# Patient Record
Sex: Male | Born: 1945 | Race: White | Hispanic: No | Marital: Married | Smoking: Former smoker
Health system: Southern US, Community
[De-identification: ages and names within clinical notes are randomized; demographics above are authoritative.]

## PROBLEM LIST (undated history)

## (undated) DIAGNOSIS — I10 Essential (primary) hypertension: Secondary | ICD-10-CM

## (undated) DIAGNOSIS — E785 Hyperlipidemia, unspecified: Secondary | ICD-10-CM

## (undated) DIAGNOSIS — N200 Calculus of kidney: Secondary | ICD-10-CM

## (undated) HISTORY — DX: Hyperlipidemia, unspecified: E78.5

## (undated) HISTORY — DX: Essential (primary) hypertension: I10

## (undated) HISTORY — DX: Calculus of kidney: N20.0

---

## 2010-06-25 DIAGNOSIS — R7689 Other specified abnormal immunological findings in serum: Secondary | ICD-10-CM | POA: Insufficient documentation

## 2010-06-25 DIAGNOSIS — R768 Other specified abnormal immunological findings in serum: Secondary | ICD-10-CM | POA: Insufficient documentation

## 2013-04-23 DIAGNOSIS — Z86010 Personal history of colonic polyps: Secondary | ICD-10-CM | POA: Insufficient documentation

## 2013-11-12 ENCOUNTER — Ambulatory Visit: Payer: Self-pay | Admitting: Urology

## 2013-11-25 DIAGNOSIS — N200 Calculus of kidney: Secondary | ICD-10-CM | POA: Insufficient documentation

## 2013-11-28 ENCOUNTER — Ambulatory Visit: Payer: Self-pay | Admitting: Urology

## 2013-11-28 DIAGNOSIS — I10 Essential (primary) hypertension: Secondary | ICD-10-CM

## 2013-11-28 LAB — BASIC METABOLIC PANEL
Anion Gap: 5 — ABNORMAL LOW (ref 7–16)
BUN: 22 mg/dL — ABNORMAL HIGH (ref 7–18)
CALCIUM: 9.2 mg/dL (ref 8.5–10.1)
CO2: 30 mmol/L (ref 21–32)
Chloride: 102 mmol/L (ref 98–107)
Creatinine: 1.02 mg/dL (ref 0.60–1.30)
EGFR (African American): >60
Glucose: 88 mg/dL (ref 65–99)
OSMOLALITY: 277 (ref 275–301)
POTASSIUM: 4.2 mmol/L (ref 3.5–5.1)
Sodium: 137 mmol/L (ref 136–145)

## 2013-12-05 ENCOUNTER — Ambulatory Visit: Payer: Self-pay | Admitting: Urology

## 2015-03-27 IMAGING — CR DG ABDOMEN 1V
1 series · 1 of 1 positions shown · non-contrast
Comparison: None.

CLINICAL DATA: Renal calculus.

EXAM:
ABDOMEN - 1 VIEW

[t abdomen supine]
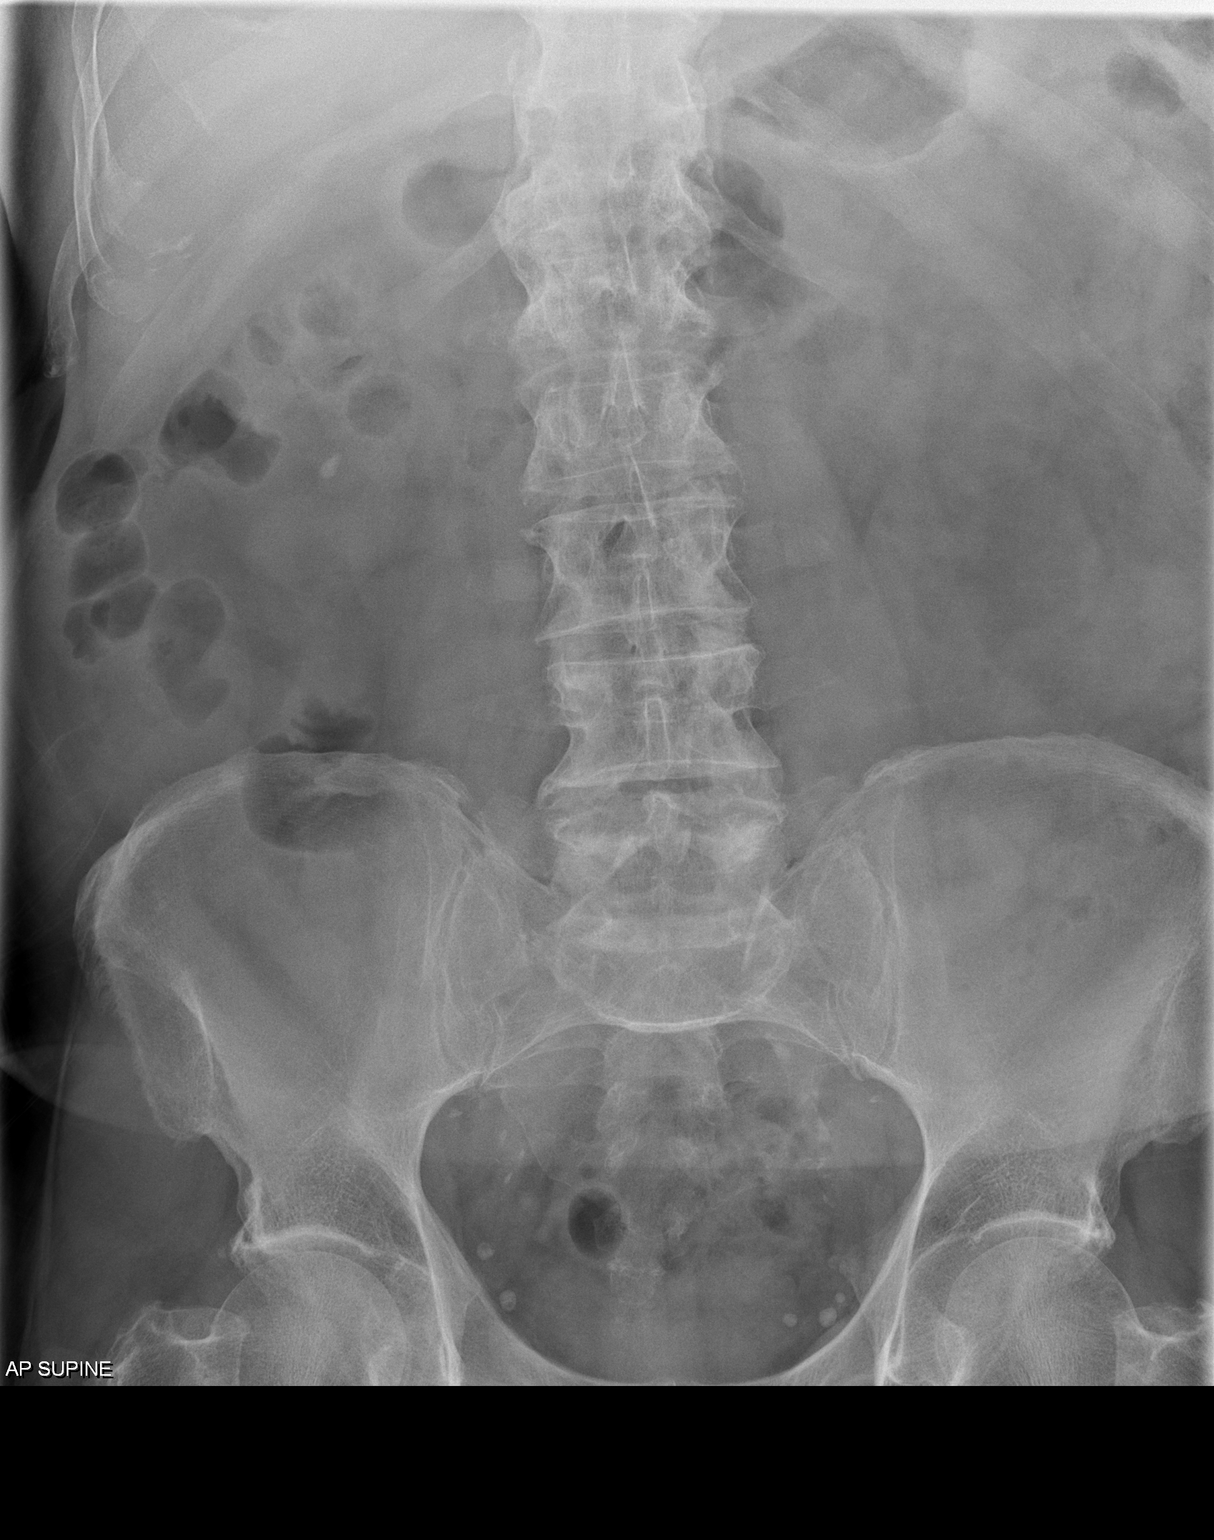

[1 of 1 positions shown; findings below may reference images not displayed]

FINDINGS: Calculus. Approximately 8 mm calculus noted over the right kidney.
Multiple pelvic calcifications are present. These most likely
represent vascular calcifications including phleboliths. Distal
ureteral stone cannot be excluded. Gas pattern is nonspecific.
Degenerative changes lumbar spine and both hips.
IMPRESSION: 8 mm stone right kidney.

## 2018-06-01 ENCOUNTER — Encounter: Payer: Self-pay | Admitting: Urology

## 2018-06-01 ENCOUNTER — Encounter

## 2018-06-01 ENCOUNTER — Ambulatory Visit: Payer: Medicare Other | Admitting: Urology

## 2018-06-01 VITALS — BP 106/71 | HR 62 | Ht 66.0 in | Wt 195.7 lb

## 2018-06-01 DIAGNOSIS — Z8249 Family history of ischemic heart disease and other diseases of the circulatory system: Secondary | ICD-10-CM | POA: Insufficient documentation

## 2018-06-01 DIAGNOSIS — I10 Essential (primary) hypertension: Secondary | ICD-10-CM | POA: Insufficient documentation

## 2018-06-01 DIAGNOSIS — H9190 Unspecified hearing loss, unspecified ear: Secondary | ICD-10-CM | POA: Insufficient documentation

## 2018-06-01 DIAGNOSIS — E669 Obesity, unspecified: Secondary | ICD-10-CM | POA: Insufficient documentation

## 2018-06-01 DIAGNOSIS — N529 Male erectile dysfunction, unspecified: Secondary | ICD-10-CM | POA: Insufficient documentation

## 2018-06-01 DIAGNOSIS — R739 Hyperglycemia, unspecified: Secondary | ICD-10-CM | POA: Insufficient documentation

## 2018-06-01 DIAGNOSIS — E782 Mixed hyperlipidemia: Secondary | ICD-10-CM | POA: Insufficient documentation

## 2018-06-01 DIAGNOSIS — Z87442 Personal history of urinary calculi: Secondary | ICD-10-CM | POA: Diagnosis not present

## 2018-06-01 DIAGNOSIS — K5790 Diverticulosis of intestine, part unspecified, without perforation or abscess without bleeding: Secondary | ICD-10-CM | POA: Insufficient documentation

## 2018-06-01 LAB — MICROSCOPIC EXAMINATION
Epithelial Cells (non renal): NONE SEEN /hpf (ref 0–10)
WBC, UA: NONE SEEN /hpf (ref 0–5)

## 2018-06-01 LAB — URINALYSIS, COMPLETE
Bilirubin, UA: NEGATIVE
GLUCOSE, UA: NEGATIVE
Ketones, UA: NEGATIVE
LEUKOCYTES UA: NEGATIVE
Nitrite, UA: NEGATIVE
PROTEIN UA: NEGATIVE
Specific Gravity, UA: 1.025 (ref 1.005–1.030)
UUROB: 0.2 mg/dL (ref 0.2–1.0)
pH, UA: 6.5 (ref 5.0–7.5)

## 2018-06-01 NOTE — Progress Notes (Signed)
06/01/2018 2:12 PM   Joe Sosa 03/26/1946 [id number]  Referring provider: Garnette CzechKlein, Jonathan E, MD [address] [place], [place] [id number]  Chief Complaint  Patient presents with  . Establish Care  . Nephrolithiasis    HPI: 72 year old male with a prior history of recurrent stone disease.  He underwent shockwave lithotripsy in 2015 by Dr. Achilles Dunkope.  He has done well however in late October had onset of dull left flank pain which initially dull was a muscle pull.  His pain increased intensity and mid to late November.  He described the pain as severe and was intermittently sharp and stabbing.  There were no identifiable precipitating, aggravating or alleviating factors.  His pain was worse in the morning and improves throughout the day.  He denied nausea, vomiting, fever, chills or lower urinary tract symptoms.  He has had a previous 24-hour urine study which showed no significant abnormalities.  His pain resolved in early December and he has been asymptomatic since that time.  He did have a stone protocol CT of the abdomen and pelvis on 05/04/2018 which showed bilateral nonobstructing punctate stones up to 4 mm in the right kidney and 3 mm in the left kidney.  There was no hydronephrosis, hydroureter or ureteral calculi.   PMH: Past Medical History:  Diagnosis Date  . Hyperlipidemia   . Hypertension   . Kidney stones     Surgical History: History reviewed. No pertinent surgical history.  Home Medications:  Allergies as of 06/01/2018   No Known Allergies     Medication List       Accurate as of June 01, 2018  2:12 PM. Always use your most recent med list.        aspirin EC 81 MG tablet Take by mouth.   atorvastatin 20 MG tablet Commonly known as:  LIPITOR Take by mouth.   Calcium Citrate 200 MG Tabs Take by mouth.   fluticasone 50 MCG/ACT nasal spray Commonly known as:  FLONASE 2 sprays by Each Nare route daily.   glucosamine-chondroitin 500-400  MG tablet Take by mouth.   hydrochlorothiazide 25 MG tablet Commonly known as:  HYDRODIURIL   lisinopril 10 MG tablet Commonly known as:  PRINIVIL,ZESTRIL   sildenafil 20 MG tablet Commonly known as:  REVATIO Take by mouth.   simvastatin 20 MG tablet Commonly known as:  ZOCOR   THERAGRAN-M ADVANCED 50 PLUS PO Take by mouth.   Vitamin D3 25 MCG (1000 UT) Caps Take by mouth.       Allergies: No Known Allergies  Family History: History reviewed. No pertinent family history.  Social History:  reports that he quit smoking about 11 years ago. His smoking use included cigarettes. He has never used smokeless tobacco. He reports previous alcohol use. He reports previous drug use.  ROS: UROLOGY Frequent Urination?: No Hard to postpone urination?: No Burning/pain with urination?: No Get up at night to urinate?: No Leakage of urine?: No Urine stream starts and stops?: No Trouble starting stream?: No Do you have to strain to urinate?: No Blood in urine?: No Urinary tract infection?: No Sexually transmitted disease?: No Injury to kidneys or bladder?: No Painful intercourse?: No Weak stream?: No Erection problems?: No Penile pain?: No  Gastrointestinal Nausea?: No Vomiting?: No Indigestion/heartburn?: No Diarrhea?: No Constipation?: No  Constitutional Fever: No Night sweats?: No Weight loss?: No Fatigue?: No  Skin Skin rash/lesions?: No Itching?: No  Eyes Blurred vision?: No Double vision?: No  Ears/Nose/Throat Sore throat?: No  Sinus problems?: No  Hematologic/Lymphatic Swollen glands?: No Easy bruising?: No  Cardiovascular Leg swelling?: No Chest pain?: No  Respiratory Cough?: No Shortness of breath?: No  Endocrine Excessive thirst?: No  Musculoskeletal Back pain?: No Joint pain?: No  Neurological Headaches?: No Dizziness?: No  Psychologic Depression?: No Anxiety?: No  Physical Exam: BP 106/71 (BP Location: Left Arm, Patient  Position: Sitting, Cuff Size: Normal)   Pulse 62   Ht 5\' 6"  (1.676 m)   Wt 195 lb 11.2 oz (88.8 kg)   BMI 31.59 kg/m   Constitutional:  Alert and oriented, No acute distress. HEENT: [place] AT, moist mucus membranes.  Trachea midline, no masses. Cardiovascular: No clubbing, cyanosis, or edema. Respiratory: Normal respiratory effort, no increased work of breathing.   Laboratory Data:  Urinalysis Microscopy negative for WBC/RBC  Assessment & Plan:   72 year old male with bilateral, nonobstructing renal calculi.  His flank pain has resolved.  CT did not show ureteral calculi or hydronephrosis.  This could have been a small, nonobstructing calculus which he passed or musculoskeletal pain.  I recommended a follow-up visit with KUB in 1 year.  He was started to call earlier for development of recurrent flank pain.  Return in about 1 year (around 06/02/2019) for Recheck, KUB.   Riki AltesScott C Torry Istre, MD  [facility] [address], [address] [place], [place] [address] [phone]

## 2019-05-14 ENCOUNTER — Telehealth: Payer: Self-pay | Admitting: Urology

## 2019-05-14 DIAGNOSIS — Z87442 Personal history of urinary calculi: Secondary | ICD-10-CM

## 2019-05-14 NOTE — Telephone Encounter (Signed)
Order placed for [place].  

## 2019-05-14 NOTE — Telephone Encounter (Signed)
This pt called to let us know he will be getting his KUB in [place], for his upcoming 12/21 appt here in [place]. Please place orders for the [place] location. Thank you.

## 2019-06-03 ENCOUNTER — Ambulatory Visit: Payer: Medicare Other | Admitting: Urology

## 2019-06-03 ENCOUNTER — Ambulatory Visit
Admission: RE | Admit: 2019-06-03 | Discharge: 2019-06-03 | Disposition: A | Discharge status: Home / Self Care | Payer: Medicare Other | Source: Ambulatory Visit | Attending: Urology | Admitting: Urology

## 2019-06-03 ENCOUNTER — Encounter: Payer: Self-pay | Admitting: Urology

## 2019-06-03 ENCOUNTER — Ambulatory Visit
Admission: RE | Admit: 2019-06-03 | Discharge: 2019-06-03 | Disposition: A | Discharge status: Home / Self Care | Payer: Medicare Other | Attending: Urology | Admitting: Urology

## 2019-06-03 ENCOUNTER — Other Ambulatory Visit: Payer: Self-pay

## 2019-06-03 VITALS — BP 144/77 | HR 67 | Ht 66.0 in | Wt 195.0 lb

## 2019-06-03 DIAGNOSIS — Z87442 Personal history of urinary calculi: Secondary | ICD-10-CM

## 2019-06-03 DIAGNOSIS — N200 Calculus of kidney: Secondary | ICD-10-CM

## 2019-06-03 NOTE — Progress Notes (Signed)
06/03/2019 2:14 PM   Joe Sosa 04-05-1946 [id number]  Referring provider: Garnette Czech, MD [address] [place],  [place] [address]  Chief Complaint  Patient presents with  . Nephrolithiasis    1year w/KUB    Urologic history: 1.  Nephrolithiasis -Shockwave lithotripsy 2015 Dr. Achilles Dunk at [facility] 04/2018; nonobstructing 4 mm right/3 mm left in addition to bilateral punctate calculi  HPI: 73 y.o. male presents for annual follow-up.  He has been asymptomatic for the most part over the last year.  He had mild right back pain lasting 2 days over the summer that resolved.  Denies bothersome lower urinary tract symptoms, dysuria, gross hematuria.  KUB performed today was reviewed and there is an approximately 4 mm right lower pole calculus and 3 mm right midpole calculus.   PMH: Past Medical History:  Diagnosis Date  . Hyperlipidemia   . Hypertension   . Kidney stones     Surgical History: No past surgical history on file.  Home Medications:  Allergies as of 06/03/2019   No Known Allergies     Medication List       Accurate as of June 03, 2019  2:14 PM. If you have any questions, ask your nurse or doctor.        STOP taking these medications   simvastatin 20 MG tablet Commonly known as: ZOCOR Stopped by: Riki Altes, MD     TAKE these medications   aspirin EC 81 MG tablet Take by mouth.   atorvastatin 20 MG tablet Commonly known as: LIPITOR Take by mouth.   Calcium Citrate 200 MG Tabs Take by mouth.   fluticasone 50 MCG/ACT nasal spray Commonly known as: FLONASE 2 sprays by Each Nare route daily.   glucosamine-chondroitin 500-400 MG tablet Take by mouth.   hydrochlorothiazide 25 MG tablet Commonly known as: HYDRODIURIL   lisinopril 10 MG tablet Commonly known as: ZESTRIL   sildenafil 20 MG tablet Commonly known as: REVATIO Take by mouth.   THERAGRAN-M ADVANCED 50 PLUS PO Take by mouth.   Vitamin D3 25 MCG  (1000 UT) Caps Take by mouth.       Allergies: No Known Allergies  Family History: No family history on file.  Social History:  reports that he quit smoking about 12 years ago. His smoking use included cigarettes. He has never used smokeless tobacco. He reports previous alcohol use. He reports previous drug use.  ROS: UROLOGY Frequent Urination?: No Hard to postpone urination?: No Burning/pain with urination?: No Get up at night to urinate?: No Leakage of urine?: No Urine stream starts and stops?: No Trouble starting stream?: No Do you have to strain to urinate?: No Blood in urine?: No Urinary tract infection?: No Sexually transmitted disease?: No Injury to kidneys or bladder?: No Painful intercourse?: No Weak stream?: No Erection problems?: No Penile pain?: No  Gastrointestinal Nausea?: No Vomiting?: No Indigestion/heartburn?: No Diarrhea?: No Constipation?: No  Constitutional Fever: No Night sweats?: No Weight loss?: No Fatigue?: No  Skin Skin rash/lesions?: No Itching?: No  Eyes Blurred vision?: No Double vision?: No  Ears/Nose/Throat Sore throat?: No Sinus problems?: No  Hematologic/Lymphatic Swollen glands?: No Easy bruising?: No  Cardiovascular Leg swelling?: No Chest pain?: No  Respiratory Cough?: No Shortness of breath?: No  Endocrine Excessive thirst?: No  Musculoskeletal Back pain?: No Joint pain?: No  Neurological Headaches?: No Dizziness?: No  Psychologic Depression?: No Anxiety?: No  Physical Exam: BP (!) 144/77   Pulse 67  Ht 5\' 6"  (1.676 m)   Wt 195 lb (88.5 kg)   BMI 31.47 kg/m   Constitutional:  Alert and oriented, No acute distress. HEENT: [place] AT, moist mucus membranes.  Trachea midline, no masses. Cardiovascular: No clubbing, cyanosis, or edema. Respiratory: Normal respiratory effort, no increased work of breathing. Skin: No rashes, bruises or suspicious lesions. Neurologic: Grossly intact, no focal  deficits, moving all 4 extremities. Psychiatric: Normal mood and affect.   Assessment & Plan:    - Bilateral nephrolithiasis Stable.  Follow-up 1 year and he was instructed to call should he develop renal colic/flank pain.   Abbie Sons, [place] [address], [place], [place] [address] [phone]

## 2019-06-04 ENCOUNTER — Ambulatory Visit: Payer: Medicare Other | Admitting: Urology

## 2019-06-05 ENCOUNTER — Ambulatory Visit: Payer: Medicare Other | Admitting: Urology

## 2020-06-15 ENCOUNTER — Encounter: Payer: Self-pay | Admitting: Urology

## 2020-06-17 ENCOUNTER — Other Ambulatory Visit: Payer: Self-pay

## 2020-06-17 ENCOUNTER — Other Ambulatory Visit: Payer: Self-pay | Admitting: Family Medicine

## 2020-06-17 ENCOUNTER — Ambulatory Visit (INDEPENDENT_AMBULATORY_CARE_PROVIDER_SITE_OTHER): Payer: Medicare PPO | Admitting: Urology

## 2020-06-17 ENCOUNTER — Ambulatory Visit
Admission: RE | Admit: 2020-06-17 | Discharge: 2020-06-17 | Disposition: A | Discharge status: Home / Self Care | Payer: Medicare PPO | Attending: Urology | Admitting: Urology

## 2020-06-17 ENCOUNTER — Encounter: Payer: Self-pay | Admitting: Urology

## 2020-06-17 ENCOUNTER — Ambulatory Visit
Admission: RE | Admit: 2020-06-17 | Discharge: 2020-06-17 | Disposition: A | Discharge status: Home / Self Care | Payer: Medicare PPO | Source: Ambulatory Visit | Attending: Urology | Admitting: Urology

## 2020-06-17 VITALS — BP 155/80 | HR 76 | Ht 66.0 in | Wt 197.0 lb

## 2020-06-17 DIAGNOSIS — N200 Calculus of kidney: Secondary | ICD-10-CM | POA: Insufficient documentation

## 2020-06-17 NOTE — Progress Notes (Signed)
° °  06/17/2020 2:30 PM   Joe Sosa [birth year] [id number]  Referring provider: Garnette Czech, MD [address] [place],  [place] [address]  Chief Complaint  Patient presents with   Nephrolithiasis    Urologic history: 1.  Nephrolithiasis -Shockwave lithotripsy 2015 Dr. Achilles Dunk at [facility] 04/2018; nonobstructing 4 mm right/3 mm left in addition to bilateral punctate calculi  HPI: 75 y.o. male presents for annual follow-up.   Since last years visit denies episodes of renal colic or flank pain  He had 2 episodes of acute onset of nausea/vomiting and malaise that resolved within 12 hours in July and December 2021.  No pain associated with these episodes  KUB performed today was reviewed and no significant change in the bilateral renal calculi  No bothersome LUTS   PMH: Past Medical History:  Diagnosis Date   Hyperlipidemia    Hypertension    Kidney stones     Surgical History: No past surgical history on file.  Home Medications:  Allergies as of 06/17/2020   No Known Allergies     Medication List       Accurate as of June 17, 2020  2:30 PM. If you have any questions, ask your nurse or doctor.        aspirin EC 81 MG tablet Take by mouth.   atorvastatin 20 MG tablet Commonly known as: LIPITOR Take by mouth.   Calcium Citrate 200 MG Tabs Take by mouth.   fluticasone 50 MCG/ACT nasal spray Commonly known as: FLONASE 2 sprays by Each Nare route daily.   glucosamine-chondroitin 500-400 MG tablet Take by mouth.   hydrochlorothiazide 25 MG tablet Commonly known as: HYDRODIURIL   lisinopril 10 MG tablet Commonly known as: ZESTRIL   sildenafil 20 MG tablet Commonly known as: REVATIO Take by mouth.   THERAGRAN-M ADVANCED 50 PLUS PO Take by mouth.   Vitamin D3 25 MCG (1000 UT) Caps Take by mouth.       Allergies: No Known Allergies  Family History: No family history on file.  Social History:  reports that he quit  smoking about 14 years ago. His smoking use included cigarettes. He has never used smokeless tobacco. He reports previous alcohol use. He reports previous drug use.   Physical Exam: BP (!) 155/80    Pulse 76    Ht 5\' 6"  (1.676 m)    Wt 197 lb (89.4 kg)    BMI 31.80 kg/m   Constitutional:  Alert and oriented, No acute distress. HEENT: [place] AT, moist mucus membranes.  Trachea midline, no masses. Cardiovascular: No clubbing, cyanosis, or edema. Respiratory: Normal respiratory effort, no increased work of breathing. Neurologic: Grossly intact, no focal deficits, moving all 4 extremities. Psychiatric: Normal mood and affect.   Pertinent Imaging: KUB images were personally reviewed and interpreted   Assessment & Plan:    1.  Nephrolithiasis  Stable, bilateral renal calculi  Unlikely episodes of nausea/vomiting last year are related to stone disease  Follow-up 1 year with KUB   , MD  [facility] [address], [address] [place], [place] [phone]

## 2020-06-19 ENCOUNTER — Encounter: Payer: Self-pay | Admitting: Urology

## 2021-06-15 ENCOUNTER — Other Ambulatory Visit: Payer: Self-pay | Admitting: Family Medicine

## 2021-06-15 DIAGNOSIS — N200 Calculus of kidney: Secondary | ICD-10-CM

## 2021-06-17 ENCOUNTER — Ambulatory Visit
Admission: RE | Admit: 2021-06-17 | Discharge: 2021-06-17 | Disposition: A | Discharge status: Home / Self Care | Payer: Medicare PPO | Source: Ambulatory Visit | Attending: Urology | Admitting: Urology

## 2021-06-17 ENCOUNTER — Ambulatory Visit: Payer: Medicare PPO | Admitting: Urology

## 2021-06-17 ENCOUNTER — Encounter: Payer: Self-pay | Admitting: Urology

## 2021-06-17 ENCOUNTER — Other Ambulatory Visit: Payer: Self-pay

## 2021-06-17 VITALS — BP 147/81 | HR 92 | Ht 66.0 in | Wt 195.0 lb

## 2021-06-17 DIAGNOSIS — N200 Calculus of kidney: Secondary | ICD-10-CM | POA: Diagnosis not present

## 2021-06-17 NOTE — Progress Notes (Signed)
06/17/2021 3:21 PM   Joe Sosa 08-22-1945 [id number]  Referring provider: Azucena Freed, MD [place],  [place] [address]  Chief Complaint  Patient presents with   Nephrolithiasis    Urologic history: 1.  Nephrolithiasis -Shockwave lithotripsy 2015 Dr. Jacqlyn Larsen at [place] 04/2018; nonobstructing 4 mm right/3 mm left in addition to bilateral punctate calculi  HPI: 76 y.o. male presents for annual follow-up.  Doing well since last visit No bothersome LUTS Denies dysuria, gross hematuria Denies flank, abdominal or pelvic pain   PMH: Past Medical History:  Diagnosis Date   Hyperlipidemia    Hypertension    Kidney stones     Surgical History: No past surgical history on file.  Home Medications:  Allergies as of 06/17/2021   No Known Allergies      Medication List        Accurate as of June 17, 2021  3:21 PM. If you have any questions, ask your nurse or doctor.          aspirin EC 81 MG tablet Take by mouth.   atorvastatin 20 MG tablet Commonly known as: LIPITOR Take by mouth.   Calcium Citrate 200 MG Tabs Take by mouth.   fluticasone 50 MCG/ACT nasal spray Commonly known as: FLONASE 2 sprays by Each Nare route daily.   glucosamine-chondroitin 500-400 MG tablet Take by mouth.   hydrochlorothiazide 25 MG tablet Commonly known as: HYDRODIURIL   lisinopril 10 MG tablet Commonly known as: ZESTRIL   sildenafil 20 MG tablet Commonly known as: REVATIO Take by mouth.   THERAGRAN-M ADVANCED 50 PLUS PO Take by mouth.   Vitamin D3 25 MCG (1000 UT) Caps Take by mouth.        Allergies: No Known Allergies  Family History: No family history on file.  Social History:  reports that he quit smoking about 15 years ago. His smoking use included cigarettes. He has never used smokeless tobacco. He reports that he does not currently use alcohol. He reports that he does not currently use drugs.   Physical  Exam: BP (!) 147/81    Pulse 92    Ht 5\' 6"  (1.676 m)    Wt 195 lb (88.5 kg)    BMI 31.47 kg/m   Constitutional:  Alert and oriented, No acute distress. HEENT: [place] AT, moist mucus membranes.  Trachea midline, no masses. Cardiovascular: No clubbing, cyanosis, or edema. Respiratory: Normal respiratory effort, no increased work of breathing. Psychiatric: Normal mood and affect.    Pertinent Imaging: The images of a KUB performed today were personally reviewed and interpreted.  Slight increased size of right lower pole calculus.  Punctate calcification overlying the superior portion of the left upper pole is stable  Abdomen 1 view (KUB)  Narrative CLINICAL DATA:  Kidney stones  EXAM: ABDOMEN - 1 VIEW  COMPARISON:  June 03, 2019  FINDINGS: There is a small stable calcification projecting over the lower pole the right kidney. There is a punctate calcification projecting over the upper pole the left kidney. Stable calcifications project over the patient's pelvis. There are degenerative changes of the hips and lumbar spine, similar to prior study.  IMPRESSION: Bilateral nephrolithiasis, grossly stable.   Electronically Signed By: Constance Holster M.D. On: 06/17/2020 14:55    Assessment & Plan:    1.  Bilateral nephrolithiasis Stable, nonobstructing renal calculi He desires to continue surveillance Continue annual follow-up with KUB   Abbie Sons, MD  [facility] [address], [place], [place] [address] [phone]

## 2022-06-14 ENCOUNTER — Other Ambulatory Visit: Payer: Self-pay | Admitting: *Deleted

## 2022-06-14 DIAGNOSIS — N200 Calculus of kidney: Secondary | ICD-10-CM

## 2022-06-17 ENCOUNTER — Ambulatory Visit
Admission: RE | Admit: 2022-06-17 | Discharge: 2022-06-17 | Disposition: A | Discharge status: Home / Self Care | Payer: Medicare PPO | Source: Ambulatory Visit | Attending: Urology | Admitting: Urology

## 2022-06-17 ENCOUNTER — Ambulatory Visit: Payer: Medicare PPO | Admitting: Urology

## 2022-06-17 ENCOUNTER — Encounter: Payer: Self-pay | Admitting: Urology

## 2022-06-17 VITALS — BP 146/79 | HR 80 | Ht 66.0 in | Wt 183.0 lb

## 2022-06-17 DIAGNOSIS — N200 Calculus of kidney: Secondary | ICD-10-CM | POA: Diagnosis not present

## 2022-06-17 NOTE — Progress Notes (Signed)
   06/17/2022 1:28 PM   Lidia Collum May 07, 1946 [id number]  Referring provider: Azucena Freed, MD [place],  [place] [address]  Chief Complaint  Patient presents with   Nephrolithiasis    Urologic history: 1.  Nephrolithiasis -Shockwave lithotripsy 2015 Dr. Jacqlyn Larsen at [place] 04/2018; nonobstructing 4 mm right/3 mm left in addition to bilateral punctate calculi  HPI: 77 y.o. male presents for annual follow-up.  Doing well since last visit No bothersome LUTS Denies dysuria, gross hematuria Denies flank, abdominal or pelvic pain   PMH: Past Medical History:  Diagnosis Date   Hyperlipidemia    Hypertension    Kidney stones     Surgical History: History reviewed. No pertinent surgical history.  Home Medications:  Allergies as of 06/17/2022   No Known Allergies      Medication List        Accurate as of June 17, 2022  1:28 PM. If you have any questions, ask your nurse or doctor.          aspirin EC 81 MG tablet Take by mouth.   atorvastatin 20 MG tablet Commonly known as: LIPITOR Take by mouth.   Calcium Citrate 200 MG Tabs Take by mouth.   fluticasone 50 MCG/ACT nasal spray Commonly known as: FLONASE 2 sprays by Each Nare route daily.   glucosamine-chondroitin 500-400 MG tablet Take by mouth.   hydrochlorothiazide 25 MG tablet Commonly known as: HYDRODIURIL   lisinopril 10 MG tablet Commonly known as: ZESTRIL   sildenafil 20 MG tablet Commonly known as: REVATIO Take by mouth.   THERAGRAN-M ADVANCED 50 PLUS PO Take by mouth.   Vitamin D3 25 MCG (1000 UT) Caps Take by mouth.        Allergies: No Known Allergies  Family History: History reviewed. No pertinent family history.  Social History:  reports that he quit smoking about 16 years ago. His smoking use included cigarettes. He has never used smokeless tobacco. He reports that he does not currently use alcohol. He reports that he does not  currently use drugs.   Physical Exam: BP (!) 146/79   Pulse 80   Ht 5\' 6"  (1.676 m)   Wt 183 lb (83 kg)   BMI 29.54 kg/m   Constitutional:  Alert and oriented, No acute distress. HEENT: [place] AT, moist mucus membranes.  Trachea midline, no masses. Cardiovascular: No clubbing, cyanosis, or edema. Respiratory: Normal respiratory effort, no increased work of breathing. Psychiatric: Normal mood and affect.    Pertinent Imaging: The images of a KUB performed today were personally reviewed and interpreted.  Stable nephrolithiasis   Assessment & Plan:    1.  Bilateral nephrolithiasis Stable, nonobstructing renal calculi He desires to continue surveillance Continue annual follow-up with [place], MD  [place] [address], [place], [place] [address] [phone]

## 2022-06-19 ENCOUNTER — Encounter: Payer: Self-pay | Admitting: Urology

## 2022-10-07 IMAGING — CR DG ABDOMEN 1V
2 series · 2 of 2 positions shown · non-contrast
Comparison: Radiograph 06/17/2020

CLINICAL DATA: Kidney stone.

EXAM:
ABDOMEN - 1 VIEW

[abdomen kub (1 of 2)]
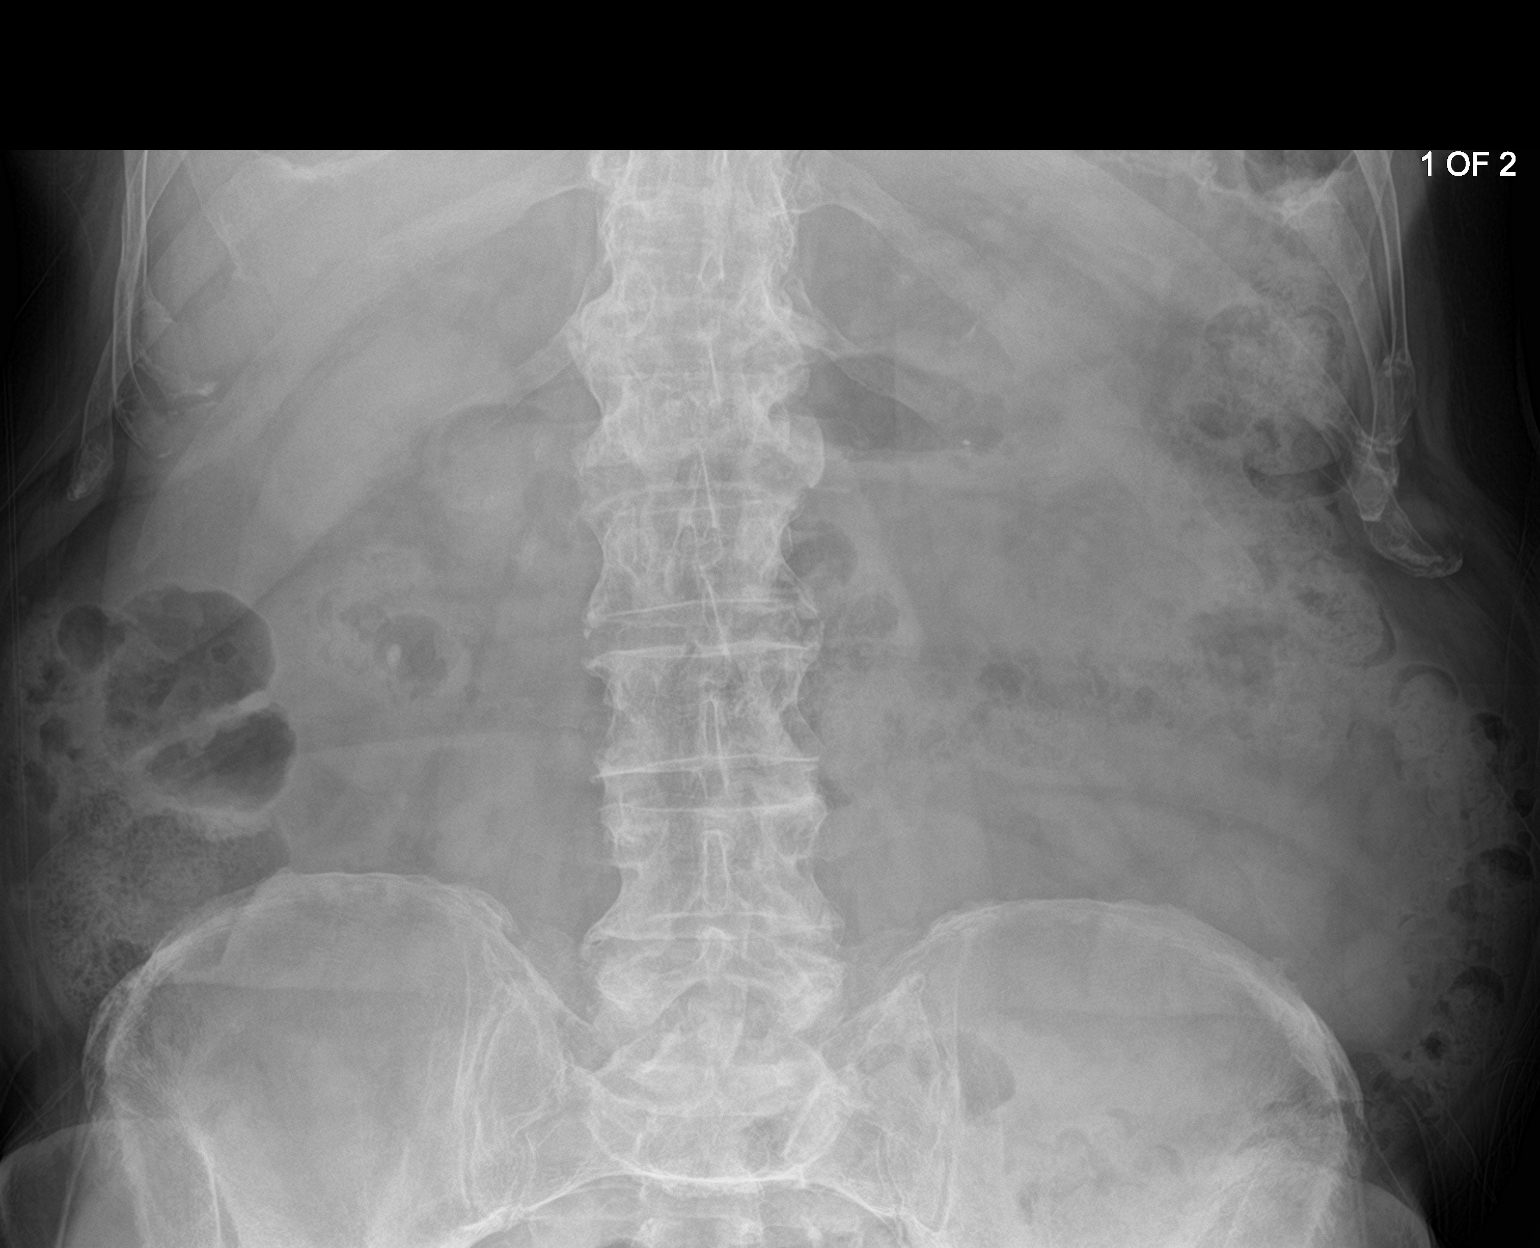

[abdomen kub (2 of 2)]
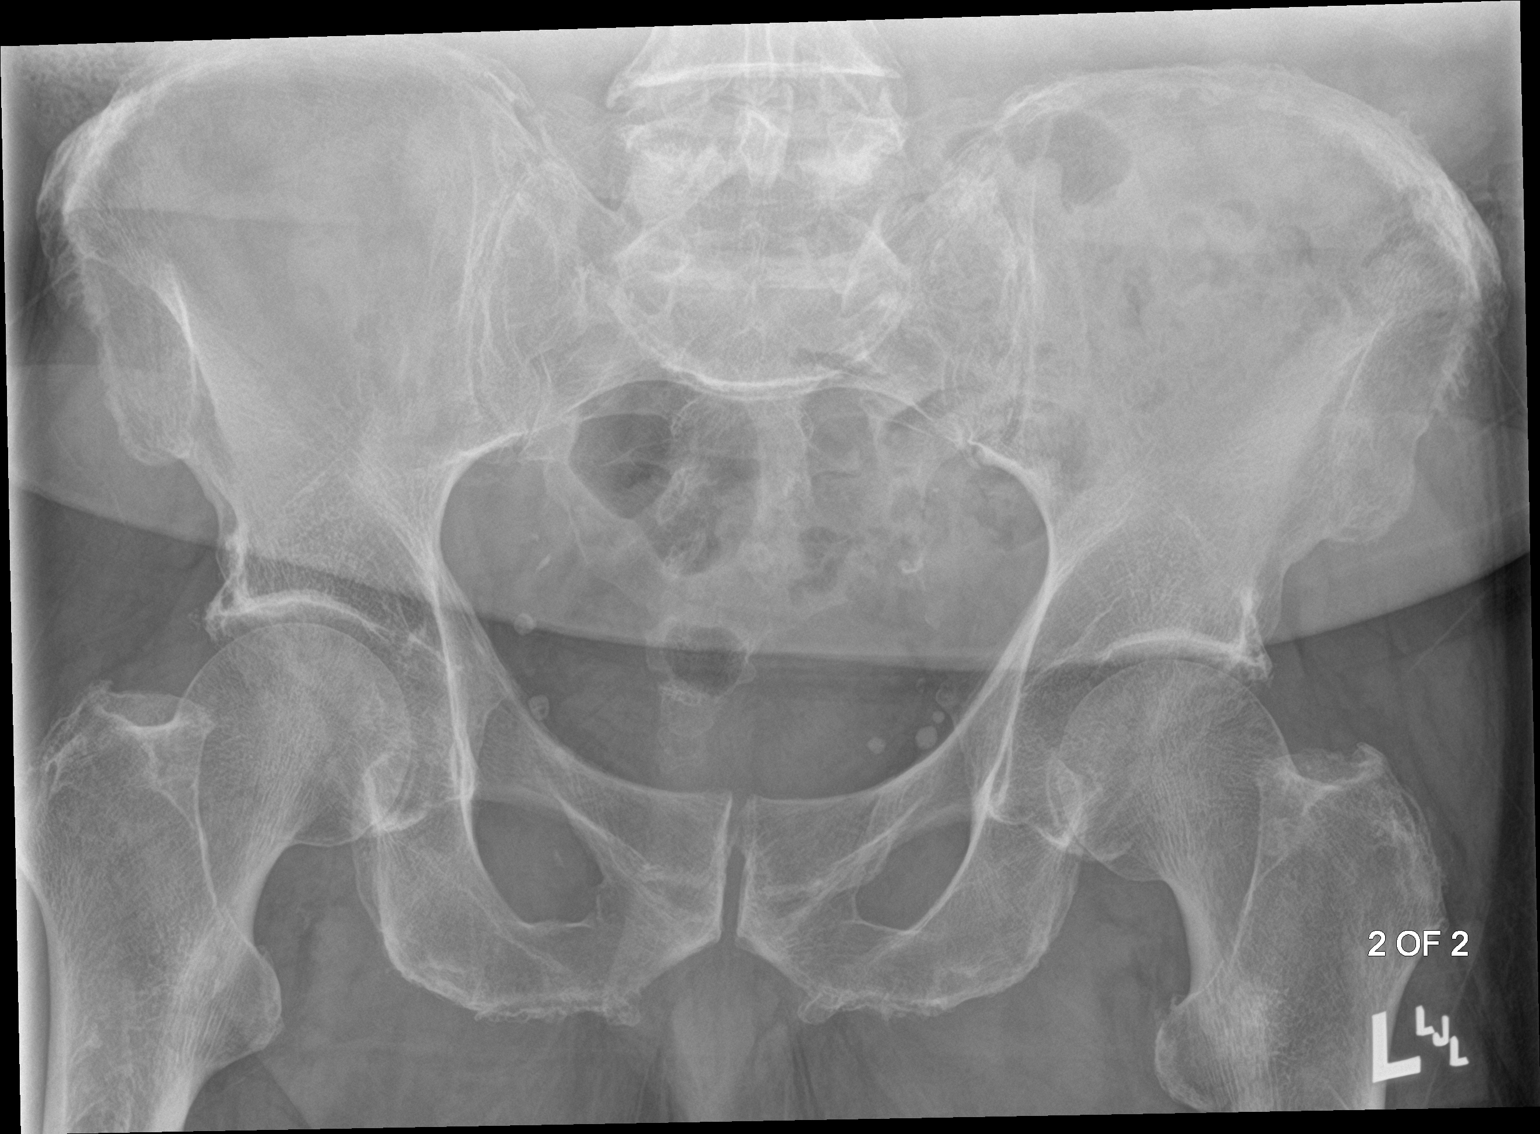

[2 of 2 positions shown; findings below may reference images not displayed]

FINDINGS: 6 mm stone projects over the lower right kidney. Punctate calculus
projecting over the mid upper left kidney. Adjacent curvilinear
calcification is felt to be vascular. No visualized ureteral stones.
Stable distribution of pelvic phleboliths. Normal bowel gas pattern
with moderate colonic stool. No acute osseous abnormalities are
seen.
IMPRESSION: Bilateral intrarenal calculi, similar to prior exam.

## 2023-05-31 ENCOUNTER — Other Ambulatory Visit: Payer: Self-pay | Admitting: *Deleted

## 2023-05-31 DIAGNOSIS — N200 Calculus of kidney: Secondary | ICD-10-CM

## 2023-06-19 ENCOUNTER — Ambulatory Visit: Payer: Medicare PPO | Admitting: Urology

## 2023-06-30 ENCOUNTER — Ambulatory Visit: Payer: Medicare PPO | Admitting: Urology

## 2023-06-30 ENCOUNTER — Encounter: Payer: Self-pay | Admitting: Urology

## 2023-06-30 ENCOUNTER — Ambulatory Visit
Admission: RE | Admit: 2023-06-30 | Discharge: 2023-06-30 | Disposition: A | Discharge status: Home / Self Care | Payer: Medicare PPO | Source: Ambulatory Visit | Attending: Urology | Admitting: Urology

## 2023-06-30 VITALS — BP 141/81 | HR 71 | Ht 66.0 in | Wt 190.0 lb

## 2023-06-30 DIAGNOSIS — N200 Calculus of kidney: Secondary | ICD-10-CM | POA: Diagnosis present

## 2023-06-30 NOTE — Progress Notes (Signed)
    I, Maysun Anabel Bene, acting as a scribe for Riki Altes, MD., have documented all relevant documentation on the behalf of Riki Altes, MD, as directed by Riki Altes, MD while in the presence of Riki Altes, MD.  06/30/2023 1:20 PM   Joe Sosa 24-Aug-1945 [id number]  Referring provider: Garnette Czech, MD [address] [place],  [place] [address]  Chief Complaint  Patient presents with   Nephrolithiasis   Urologic history: 1.  Nephrolithiasis Shockwave lithotripsy 2015 [facility]. [facility]illes [facility]unk at [facility] 04/2018; nonobstructing 4 mm right/3 mm left in addition to bilateral punctate calculi   HPI: Joe Sosa is a 78 y.o. male presents for annual follow-up.   Denies flank or pelvic pain; has occasional lower abdominal discomfort, which occurs at bedtime.   PMH: Past Medical History:  Diagnosis Date   Hyperlipidemia    Hypertension    Kidney stones     Home Medications:  Allergies as of 06/30/2023   No Known Allergies      Medication List        Accurate as of June 30, 2023  1:20 PM. If you have any questions, ask your nurse or doctor.          STOP taking these medications    hydrochlorothiazide 25 MG tablet Commonly known as: HYDRODIURIL Stopped by: Riki Altes       TAKE these medications    aspirin EC 81 MG tablet Take by mouth.   atorvastatin 20 MG tablet Commonly known as: LIPITOR Take by mouth.   Calcium Citrate 200 MG Tabs Take by mouth.   glucosamine-chondroitin 500-400 MG tablet Take by mouth.   lisinopril 10 MG tablet Commonly known as: ZESTRIL   sildenafil 20 MG tablet Commonly known as: REVATIO Take by mouth.   THERAGRAN-M ADVANCED 50 PLUS PO Take by mouth.   Vitamin D3 25 MCG (1000 UT) Caps Take by mouth.        Allergies: No Known Allergies  Social History:  reports that he quit smoking about 17 years ago. His smoking use included cigarettes. He has never used smokeless  tobacco. He reports that he does not currently use alcohol. He reports that he does not currently use drugs.   Physical Exam: BP (!) 141/81   Pulse 71   Ht 5\' 6"  (1.676 m)   Wt 190 lb (86.2 kg)   BMI 30.67 kg/m   Constitutional:  Alert and oriented, No acute distress. HEENT: [place] AT, moist mucus membranes.  Trachea midline, no masses. Cardiovascular: No clubbing, cyanosis, or edema. Respiratory: Normal respiratory effort, no increased work of breathing. GI: Abdomen is soft, nontender, nondistended, no abdominal masses Skin: No rashes, bruises or suspicious lesions. Neurologic: Grossly intact, no focal deficits, moving all 4 extremities. Psychiatric: Normal mood and affect.   Pertinent Imaging: The images of a KUB performed today were personally reviewed and interpreted. Stable 8 mm right lower pole calcification.  Assessment & Plan:    1. Bilateral nephrolithiasis Only the right lower pole calculus visualized on KUB.  Offered him every other year visit, however he desired to continue annual follow-up. He will discuss with wife and call if he changes his mind.  1 year follow-up with KUB schedule.  [facility] [address], [address] [place], [place] [address] [phone]

## 2024-06-20 ENCOUNTER — Other Ambulatory Visit: Payer: Self-pay | Admitting: *Deleted

## 2024-06-20 DIAGNOSIS — N200 Calculus of kidney: Secondary | ICD-10-CM

## 2024-06-28 ENCOUNTER — Ambulatory Visit: Payer: Self-pay | Admitting: Urology

## 2024-07-02 ENCOUNTER — Encounter: Payer: Self-pay | Admitting: Urology

## 2024-07-02 ENCOUNTER — Ambulatory Visit
Admission: RE | Admit: 2024-07-02 | Discharge: 2024-07-02 | Disposition: A | Discharge status: Home / Self Care | Attending: Urology | Admitting: Urology

## 2024-07-02 ENCOUNTER — Ambulatory Visit
Admission: RE | Admit: 2024-07-02 | Discharge: 2024-07-02 | Disposition: A | Source: Ambulatory Visit | Attending: Urology | Admitting: Urology

## 2024-07-02 ENCOUNTER — Ambulatory Visit: Admitting: Urology

## 2024-07-02 VITALS — BP 134/86 | HR 97 | Ht 66.0 in | Wt 175.0 lb

## 2024-07-02 DIAGNOSIS — N200 Calculus of kidney: Secondary | ICD-10-CM | POA: Diagnosis not present

## 2024-07-02 NOTE — Progress Notes (Signed)
" ° °  07/02/2024 3:23 PM   Joe Sosa [place] [birth year] [id number]  Referring provider: Fernande Dorn BRAVO, MD [address] [place],  [place] [address]  Chief Complaint  Patient presents with   Nephrolithiasis    Urologic history: 1.  Nephrolithiasis Shockwave lithotripsy 2015 Dr. Ike at [facility] CT [facility] 04/2018; nonobstructing 4 mm right/3 mm left in addition to bilateral punctate calculi  HPI: Joe Sosa is a 79 y.o. male who presents for annual follow-up.  No significant changes since last year's visit. No flank, abdominal, pelvic pain Denies gross hematuria   PMH: Past Medical History:  Diagnosis Date   Hyperlipidemia    Hypertension    Kidney stones     Surgical History: History reviewed. No pertinent surgical history.  Home Medications:  Allergies as of 07/02/2024   No Known Allergies      Medication List        Accurate as of July 02, 2024  3:23 PM. If you have any questions, ask your nurse or doctor.          STOP taking these medications    lisinopril 10 MG tablet Commonly known as: ZESTRIL Stopped by: Glendia Barba, MD       TAKE these medications    aspirin EC 81 MG tablet Take by mouth.   atorvastatin 20 MG tablet Commonly known as: LIPITOR Take by mouth.   Calcium Citrate 200 MG Tabs Take by mouth.   glucosamine-chondroitin 500-400 MG tablet Take by mouth.   sildenafil 20 MG tablet Commonly known as: REVATIO Take by mouth.   THERAGRAN-M ADVANCED 50 PLUS PO Take by mouth.   Vitamin D3 25 MCG (1000 UT) Caps Take by mouth.        Allergies: Allergies[1]  Family History: History reviewed. No pertinent family history.  Social History:  reports that he quit smoking about 18 years ago. His smoking use included cigarettes. He has never used smokeless tobacco. He reports that he does not currently use alcohol. He reports that he does not currently use drugs.   Physical Exam: BP 134/86   Pulse 97   Ht 5'  6 (1.676 m)   Wt 175 lb (79.4 kg)   BMI 28.25 kg/m   Constitutional:  Alert, No acute distress. HEENT: Tina AT Respiratory: Normal respiratory effort, no increased work of breathing. Psychiatric: Normal mood and affect.   Pertinent Imaging: KUB performed prior to today's visit was personally reviewed and interpreted.  The right renal calculus has increased slightly in size and measures 10 mm.  There is a second 3 mm calculus inferior to the larger calculus.  No left-sided calcifications are identified on today's x-ray.  Today's x-ray has not yet been read by radiology.   Assessment & Plan:    Bilateral nephrolithiasis Slow interval growth of the right renal calculus We discussed continued surveillance versus treatment with SWL and he has elected surveillance 1 year follow-up with KUB The left renal calculus is not identified on today's x-ray   Glendia JAYSON Barba, MD  [facility] [address], [address] [place], [place] [address] [phone]     [1] No Known Allergies  "

## 2025-07-02 ENCOUNTER — Ambulatory Visit: Admitting: Urology
